# Patient Record
Sex: Male | Born: 1975 | Race: White | Hispanic: No | Marital: Single | Smoking: Former smoker
Health system: Southern US, Community
[De-identification: ages and names within clinical notes are randomized; demographics above are authoritative.]

## PROBLEM LIST (undated history)

## (undated) DIAGNOSIS — G40909 Epilepsy, unspecified, not intractable, without status epilepticus: Secondary | ICD-10-CM

## (undated) DIAGNOSIS — H47033 Optic nerve hypoplasia, bilateral: Secondary | ICD-10-CM

## (undated) DIAGNOSIS — F319 Bipolar disorder, unspecified: Secondary | ICD-10-CM

## (undated) HISTORY — PX: EYE SURGERY: SHX253

---

## 2016-12-24 ENCOUNTER — Other Ambulatory Visit (HOSPITAL_BASED_OUTPATIENT_CLINIC_OR_DEPARTMENT_OTHER): Payer: Self-pay | Admitting: Osteopathic Medicine

## 2016-12-24 DIAGNOSIS — N50812 Left testicular pain: Secondary | ICD-10-CM

## 2016-12-27 ENCOUNTER — Ambulatory Visit (HOSPITAL_BASED_OUTPATIENT_CLINIC_OR_DEPARTMENT_OTHER): Payer: Self-pay

## 2016-12-27 ENCOUNTER — Ambulatory Visit (HOSPITAL_BASED_OUTPATIENT_CLINIC_OR_DEPARTMENT_OTHER): Admission: RE | Admit: 2016-12-27 | Payer: Self-pay | Source: Ambulatory Visit

## 2017-01-01 ENCOUNTER — Ambulatory Visit (HOSPITAL_BASED_OUTPATIENT_CLINIC_OR_DEPARTMENT_OTHER)
Admission: RE | Admit: 2017-01-01 | Discharge: 2017-01-01 | Disposition: A | Discharge status: Home / Self Care | Payer: BC Managed Care – PPO | Source: Ambulatory Visit | Attending: Osteopathic Medicine | Admitting: Osteopathic Medicine

## 2017-01-01 DIAGNOSIS — N50812 Left testicular pain: Secondary | ICD-10-CM | POA: Insufficient documentation

## 2017-01-01 DIAGNOSIS — I861 Scrotal varices: Secondary | ICD-10-CM | POA: Diagnosis not present

## 2021-08-10 DEATH — deceased

## 2022-01-12 ENCOUNTER — Encounter (HOSPITAL_BASED_OUTPATIENT_CLINIC_OR_DEPARTMENT_OTHER): Payer: Self-pay | Admitting: Emergency Medicine

## 2022-01-12 ENCOUNTER — Emergency Department (HOSPITAL_BASED_OUTPATIENT_CLINIC_OR_DEPARTMENT_OTHER): Payer: BC Managed Care – PPO

## 2022-01-12 ENCOUNTER — Emergency Department (HOSPITAL_BASED_OUTPATIENT_CLINIC_OR_DEPARTMENT_OTHER)
Admission: EM | Admit: 2022-01-12 | Discharge: 2022-01-12 | Disposition: A | Discharge status: Home / Self Care | Payer: BC Managed Care – PPO | Attending: Emergency Medicine | Admitting: Emergency Medicine

## 2022-01-12 ENCOUNTER — Other Ambulatory Visit: Payer: Self-pay

## 2022-01-12 DIAGNOSIS — R072 Precordial pain: Secondary | ICD-10-CM | POA: Insufficient documentation

## 2022-01-12 DIAGNOSIS — R202 Paresthesia of skin: Secondary | ICD-10-CM | POA: Insufficient documentation

## 2022-01-12 DIAGNOSIS — D75839 Thrombocytosis, unspecified: Secondary | ICD-10-CM | POA: Diagnosis not present

## 2022-01-12 DIAGNOSIS — R200 Anesthesia of skin: Secondary | ICD-10-CM | POA: Diagnosis present

## 2022-01-12 HISTORY — DX: Optic nerve hypoplasia, bilateral: H47.033

## 2022-01-12 HISTORY — DX: Bipolar disorder, unspecified: F31.9

## 2022-01-12 HISTORY — DX: Epilepsy, unspecified, not intractable, without status epilepticus: G40.909

## 2022-01-12 LAB — BASIC METABOLIC PANEL
Anion gap: 6 (ref 5–15)
BUN: 24 mg/dL — ABNORMAL HIGH (ref 6–20)
CO2: 26 mmol/L (ref 22–32)
Calcium: 8.7 mg/dL — ABNORMAL LOW (ref 8.9–10.3)
Chloride: 104 mmol/L (ref 98–111)
Creatinine, Ser: 0.86 mg/dL (ref 0.61–1.24)
GFR, Estimated: >60 mL/min (ref >60)
Glucose, Bld: 107 mg/dL — ABNORMAL HIGH (ref 70–99)
Potassium: 3.8 mmol/L (ref 3.5–5.1)
Sodium: 136 mmol/L (ref 135–145)

## 2022-01-12 LAB — TROPONIN I (HIGH SENSITIVITY)
Troponin I (High Sensitivity): <2 ng/L (ref <18)
Troponin I (High Sensitivity): <2 ng/L (ref <18)

## 2022-01-12 LAB — CBC
HCT: 45.4 % (ref 39.0–52.0)
Hemoglobin: 15.8 g/dL (ref 13.0–17.0)
MCH: 30.2 pg (ref 26.0–34.0)
MCHC: 34.8 g/dL (ref 30.0–36.0)
MCV: 86.6 fL (ref 80.0–100.0)
Platelets: 558 10*3/uL — ABNORMAL HIGH (ref 150–400)
RBC: 5.24 MIL/uL (ref 4.22–5.81)
RDW: 12.4 % (ref 11.5–15.5)
WBC: 8.4 10*3/uL (ref 4.0–10.5)
nRBC: 0 % (ref 0.0–0.2)

## 2022-01-12 LAB — RAPID URINE DRUG SCREEN, HOSP PERFORMED
Amphetamines: NOT DETECTED
Barbiturates: NOT DETECTED
Benzodiazepines: NOT DETECTED
Cocaine: NOT DETECTED
Opiates: NOT DETECTED
Tetrahydrocannabinol: NOT DETECTED

## 2022-01-12 MED ORDER — IOHEXOL 350 MG/ML SOLN
75.0000 mL | Freq: Once | INTRAVENOUS | Status: AC | PRN
  Administered 2022-01-12: 75 mL via INTRAVENOUS

## 2022-01-12 MED ORDER — ALUM & MAG HYDROXIDE-SIMETH 200-200-20 MG/5ML PO SUSP
30.0000 mL | Freq: Once | ORAL | Status: AC
  Administered 2022-01-12: 30 mL via ORAL
  Filled 2022-01-12: qty 30

## 2022-01-12 NOTE — ED Triage Notes (Signed)
Numbness in feet X 1 week laying down tonight felt disoriented and chest pain.

## 2022-01-12 NOTE — ED Provider Notes (Signed)
[facility] EMERGENCY DEPARTMENT Provider Note   CSN: [id number] Arrival date & time: 01/12/22  0017     History  Chief Complaint  Patient presents with   Numbness   Chest Pain    Jesse Raymond is a 46 y.o. male.  The history is provided by the patient.  Chest Pain Pain location:  Substernal area Pain quality: dull   Pain radiates to:  Does not radiate Pain severity:  Moderate Onset quality:  Sudden Duration:  5 hours Progression:  Unchanged Chronicity:  New Context: at rest   Relieved by:  Nothing Worsened by:  Nothing Ineffective treatments:  None tried Associated symptoms: no abdominal pain, no back pain, no cough, no diaphoresis, no fever, no headache, no lower extremity edema, no orthopnea, no palpitations, no PND, no shortness of breath, no vomiting and no weakness   Risk factors: male sex   Risk factors: no aortic disease and no hypertension   Patient with bipolar disorder presents with paresthesias in feet for more than a week.  No weakness, no changes in vision or speech.  Tonight, at rest then developed chest pain.  No DOE, no SOB, no nausea no vomiting no diaphoresis.  No leg pain or swelling.  States he has been feeling anxious lately.  No f/c/r.  No wounds on the feet.  No trauma  Past Medical History:  Diagnosis Date   Bipolar 1 disorder (HCC)    Epilepsy (HCC)    Optic nerve hypoplasia of both eyes        Home Medications Prior to Admission medications   Not on File      Allergies    Patient has no known allergies.    Review of Systems   Review of Systems  Constitutional:  Negative for diaphoresis and fever.  HENT:  Negative for congestion.   Eyes:  Negative for redness.  Respiratory:  Negative for cough and shortness of breath.   Cardiovascular:  Positive for chest pain. Negative for palpitations, orthopnea, leg swelling and PND.  Gastrointestinal:  Negative for abdominal pain and vomiting.  Musculoskeletal:  Negative for back  pain, neck pain and neck stiffness.  Skin:  Negative for wound.  Neurological:  Negative for facial asymmetry, weakness and headaches.  Psychiatric/Behavioral:  Negative for agitation. The patient is nervous/anxious.   All other systems reviewed and are negative.  Physical Exam Updated Vital Signs BP (!) 138/92    Pulse 95    Temp 98.6 F (37 C) (Oral)    Resp 13    Ht 5\' 11"  (1.803 m)    Wt 113.4 kg    SpO2 99%    BMI 34.87 kg/m  Physical Exam Vitals and nursing note reviewed. Exam conducted with a chaperone present.  Constitutional:      General: He is not in acute distress.    Appearance: Normal appearance.  HENT:     Head: Normocephalic and atraumatic.     Nose: Nose normal.  Eyes:     Conjunctiva/sclera: Conjunctivae normal.     Pupils: Pupils are equal, round, and reactive to light.  Cardiovascular:     Rate and Rhythm: Normal rate and regular rhythm.     Pulses: Normal pulses.     Heart sounds: Normal heart sounds.  Pulmonary:     Effort: Pulmonary effort is normal.     Breath sounds: Normal breath sounds.  Abdominal:     General: Abdomen is flat. Bowel sounds are normal.  Palpations: Abdomen is soft.     Tenderness: There is no abdominal tenderness. There is no guarding.  Musculoskeletal:        General: No tenderness. Normal range of motion.     Cervical back: Normal range of motion and neck supple.     Right lower leg: No edema.     Left lower leg: No edema.  Skin:    General: Skin is warm and dry.     Capillary Refill: Capillary refill takes less than 2 seconds.  Neurological:     General: No focal deficit present.     Mental Status: He is alert and oriented to person, place, and time.     Sensory: No sensory deficit.     Deep Tendon Reflexes: Reflexes normal.     Comments: Sensation and motor are intact to direct confrontation to all nerve distributions of the feet ankles and lower legs B.  There is no numbness.  Psychiatric:        Thought Content:  Thought content normal.    ED Results / Procedures / Treatments   Labs (all labs ordered are listed, but only abnormal results are displayed) Results for orders placed or performed during the hospital encounter of 01/12/22  Basic metabolic panel  Result Value Ref Range   Sodium 136 135 - 145 mmol/L   Potassium 3.8 3.5 - 5.1 mmol/L   Chloride 104 98 - 111 mmol/L   CO2 26 22 - 32 mmol/L   Glucose, Bld 107 (H) 70 - 99 mg/dL   BUN 24 (H) 6 - 20 mg/dL   Creatinine, Ser 9.140.86 0.61 - 1.24 mg/dL   Calcium 8.7 (L) 8.9 - 10.3 mg/dL   GFR, Estimated >78>60 >29>60 mL/min   Anion gap 6 5 - 15  CBC  Result Value Ref Range   WBC 8.4 4.0 - 10.5 K/uL   RBC 5.24 4.22 - 5.81 MIL/uL   Hemoglobin 15.8 13.0 - 17.0 g/dL   HCT 56.245.4 13.039.0 - 86.552.0 %   MCV 86.6 80.0 - 100.0 fL   MCH 30.2 26.0 - 34.0 pg   MCHC 34.8 30.0 - 36.0 g/dL   RDW 78.412.4 69.611.5 - 29.515.5 %   Platelets 558 (H) 150 - 400 K/uL   nRBC 0.0 0.0 - 0.2 %  Rapid urine drug screen (hospital performed)  Result Value Ref Range   Opiates NONE DETECTED NONE DETECTED   Cocaine NONE DETECTED NONE DETECTED   Benzodiazepines NONE DETECTED NONE DETECTED   Amphetamines NONE DETECTED NONE DETECTED   Tetrahydrocannabinol NONE DETECTED NONE DETECTED   Barbiturates NONE DETECTED NONE DETECTED  Troponin I (High Sensitivity)  Result Value Ref Range   Troponin I (High Sensitivity) <2 <18 ng/L  Troponin I (High Sensitivity)  Result Value Ref Range   Troponin I (High Sensitivity) <2 <18 ng/L   DG Chest 2 View  Result Date: 01/12/2022 CLINICAL DATA:  Chest pain, numbness in feet EXAM: CHEST - 2 VIEW COMPARISON:  None. FINDINGS: Cardiac and mediastinal contours are within normal limits. No focal pulmonary opacity. No pleural effusion or pneumothorax. No acute osseous abnormality. IMPRESSION: No acute cardiopulmonary process. Electronically Signed   By: Wiliam KeAlison  Vasan M.D.   On: 01/12/2022 00:43   CT Angio Chest PE W and/or Wo Contrast  Result Date: 01/12/2022 CLINICAL  DATA:  Chest pain EXAM: CT ANGIOGRAPHY CHEST WITH CONTRAST TECHNIQUE: Multidetector CT imaging of the chest was performed using the standard protocol during bolus administration of intravenous contrast. Multiplanar CT  image reconstructions and MIPs were obtained to evaluate the vascular anatomy. RADIATION DOSE REDUCTION: This exam was performed according to the departmental dose-optimization program which includes automated exposure control, adjustment of the mA and/or kV according to patient size and/or use of iterative reconstruction technique. CONTRAST:  38mL OMNIPAQUE IOHEXOL 350 MG/ML SOLN COMPARISON:  None. FINDINGS: Cardiovascular: Satisfactory opacification the bilateral pulmonary arteries to the lobar level. No evidence of pulmonary embolism. Although not tailored for evaluation of the thoracic aorta, there is no evidence thoracic aortic aneurysm or dissection. The heart is normal in size.  No pericardial effusion. Mediastinum/Nodes: No suspicious mediastinal lymphadenopathy. Visualized thyroid is unremarkable. Lungs/Pleura: Lungs are clear. No suspicious pulmonary nodules. No focal consolidation. No pleural effusion or pneumothorax. Upper Abdomen: Visualized upper abdomen is grossly unremarkable. Musculoskeletal: Degenerative changes of the visualized thoracolumbar spine. Review of the MIP images confirms the above findings. IMPRESSION: No evidence of pulmonary embolism. Negative CT chest. Electronically Signed   By: Charline Bills M.D.   On: 01/12/2022 03:36    EKG EKG Interpretation  Date/Time:  Friday January 12 2022 00:29:14 EST Ventricular Rate:  112 PR Interval:  190 QRS Duration: 92 QT Interval:  318 QTC Calculation: 434 R Axis:   57 Text Interpretation: Sinus tachycardia Confirmed by Verma Grothaus ([id number]) on 01/12/2022 3:01:05 AM  Radiology DG Chest 2 View  Result Date: 01/12/2022 CLINICAL DATA:  Chest pain, numbness in feet EXAM: CHEST - 2 VIEW COMPARISON:  None. FINDINGS:  Cardiac and mediastinal contours are within normal limits. No focal pulmonary opacity. No pleural effusion or pneumothorax. No acute osseous abnormality. IMPRESSION: No acute cardiopulmonary process. Electronically Signed   By: Wiliam Ke M.D.   On: 01/12/2022 00:43   CT Angio Chest PE W and/or Wo Contrast  Result Date: 01/12/2022 CLINICAL DATA:  Chest pain EXAM: CT ANGIOGRAPHY CHEST WITH CONTRAST TECHNIQUE: Multidetector CT imaging of the chest was performed using the standard protocol during bolus administration of intravenous contrast. Multiplanar CT image reconstructions and MIPs were obtained to evaluate the vascular anatomy. RADIATION DOSE REDUCTION: This exam was performed according to the departmental dose-optimization program which includes automated exposure control, adjustment of the mA and/or kV according to patient size and/or use of iterative reconstruction technique. CONTRAST:  38mL OMNIPAQUE IOHEXOL 350 MG/ML SOLN COMPARISON:  None. FINDINGS: Cardiovascular: Satisfactory opacification the bilateral pulmonary arteries to the lobar level. No evidence of pulmonary embolism. Although not tailored for evaluation of the thoracic aorta, there is no evidence thoracic aortic aneurysm or dissection. The heart is normal in size.  No pericardial effusion. Mediastinum/Nodes: No suspicious mediastinal lymphadenopathy. Visualized thyroid is unremarkable. Lungs/Pleura: Lungs are clear. No suspicious pulmonary nodules. No focal consolidation. No pleural effusion or pneumothorax. Upper Abdomen: Visualized upper abdomen is grossly unremarkable. Musculoskeletal: Degenerative changes of the visualized thoracolumbar spine. Review of the MIP images confirms the above findings. IMPRESSION: No evidence of pulmonary embolism. Negative CT chest. Electronically Signed   By: Charline Bills M.D.   On: 01/12/2022 03:36    Procedures Procedures    Medications Ordered in ED Medications  iohexol (OMNIPAQUE) 350 MG/ML  injection 75 mL (75 mLs Intravenous Contrast Given 01/12/22 0317)  alum & mag hydroxide-simeth (MAALOX/MYLANTA) 200-200-20 MG/5ML suspension 30 mL (30 mLs Oral Given 01/12/22 0405)    ED Course/ Medical Decision Making/ A&P                           Medical Decision Making Chest pain tonight  at rest and paresthesias for more than a week.    Amount and/or Complexity of Data Reviewed Independent Historian: spouse    Details: patient was at rest with chest pain External Data Reviewed: labs.    Details: CBC shows thrombocytosis as far back as 4 years ago.  5 cbc exams evaluated all with elevated platelets. Labs: ordered.    Details: normal troponins, normal electrolytes.  thrombocytosis, platelets 558 Radiology: ordered.    Details: Negative CTA and chest xray by me, agree with radiology ECG/medicine tests: ordered.    Details: See muse  Risk OTC drugs. Prescription drug management. Decision regarding hospitalization. Risk Details: Given chest pain, EDP considered hospitalization but patient is well appearing with negative EKG, troponin and negative CTA of the chest.  Ruled out for PE in the ED with low risk history and negative CTA.  Ruled out for MI in the ED with very atypical history for ischemia, negative EKG and 2 negative troponins. Heart score is 2 (age and BMI) low risk for MACE.  I suspect this is anxiety, perhaps related to the paresthesias of the feet.  As sensation is intact to confrontation without weakness.      Final Clinical Impression(s) / ED Diagnoses Final diagnoses:  Paresthesia  Thrombocytosis   Return for intractable cough, coughing up blood, fevers > 100.4 unrelieved by medication, shortness of breath, intractable vomiting, chest pain, shortness of breath, weakness, numbness, changes in speech, facial asymmetry, abdominal pain, passing out, Inability to tolerate liquids or food, cough, altered mental status or any concerns. No signs of systemic illness or infection.  The patient is nontoxic-appearing on exam and vital signs are within normal limits.  I have reviewed the triage vital signs and the nursing notes. Pertinent labs & imaging results that were available during my care of the patient were reviewed by me and considered in my medical decision making (see chart for details). After history, exam, and medical workup I feel the patient has been appropriately medically screened and is safe for discharge home. Pertinent diagnoses were discussed with the patient. Patient was given return precautions.  Rx / DC Orders ED Discharge Orders     None         Liah Morr, MD 01/12/22 [phone]

## 2022-06-01 ENCOUNTER — Other Ambulatory Visit: Payer: Self-pay

## 2022-06-01 ENCOUNTER — Encounter (HOSPITAL_BASED_OUTPATIENT_CLINIC_OR_DEPARTMENT_OTHER): Payer: Self-pay | Admitting: Emergency Medicine

## 2022-06-01 ENCOUNTER — Emergency Department (HOSPITAL_BASED_OUTPATIENT_CLINIC_OR_DEPARTMENT_OTHER)
Admission: EM | Admit: 2022-06-01 | Discharge: 2022-06-01 | Disposition: A | Discharge status: Home / Self Care | Payer: BC Managed Care – PPO | Attending: Emergency Medicine | Admitting: Emergency Medicine

## 2022-06-01 DIAGNOSIS — I10 Essential (primary) hypertension: Secondary | ICD-10-CM

## 2022-06-01 DIAGNOSIS — S0993XA Unspecified injury of face, initial encounter: Secondary | ICD-10-CM | POA: Diagnosis present

## 2022-06-01 DIAGNOSIS — S01419A Laceration without foreign body of unspecified cheek and temporomandibular area, initial encounter: Secondary | ICD-10-CM | POA: Diagnosis not present

## 2022-06-01 DIAGNOSIS — S0181XA Laceration without foreign body of other part of head, initial encounter: Secondary | ICD-10-CM

## 2022-06-01 DIAGNOSIS — W268XXA Contact with other sharp object(s), not elsewhere classified, initial encounter: Secondary | ICD-10-CM | POA: Insufficient documentation

## 2023-03-03 ENCOUNTER — Emergency Department (HOSPITAL_BASED_OUTPATIENT_CLINIC_OR_DEPARTMENT_OTHER): Payer: BC Managed Care – PPO

## 2023-03-03 ENCOUNTER — Other Ambulatory Visit: Payer: Self-pay

## 2023-03-03 ENCOUNTER — Emergency Department (HOSPITAL_BASED_OUTPATIENT_CLINIC_OR_DEPARTMENT_OTHER)
Admission: EM | Admit: 2023-03-03 | Discharge: 2023-03-03 | Disposition: A | Discharge status: Home / Self Care | Payer: BC Managed Care – PPO | Attending: Emergency Medicine | Admitting: Emergency Medicine

## 2023-03-03 ENCOUNTER — Encounter (HOSPITAL_BASED_OUTPATIENT_CLINIC_OR_DEPARTMENT_OTHER): Payer: Self-pay | Admitting: Pediatrics

## 2023-03-03 DIAGNOSIS — N50811 Right testicular pain: Secondary | ICD-10-CM | POA: Diagnosis present

## 2023-03-03 DIAGNOSIS — N451 Epididymitis: Secondary | ICD-10-CM | POA: Diagnosis not present

## 2023-03-03 LAB — URINALYSIS, MICROSCOPIC (REFLEX)

## 2023-03-03 LAB — URINALYSIS, ROUTINE W REFLEX MICROSCOPIC
Bilirubin Urine: NEGATIVE
Glucose, UA: NEGATIVE mg/dL
Ketones, ur: NEGATIVE mg/dL
Leukocytes,Ua: NEGATIVE
Nitrite: NEGATIVE
Protein, ur: NEGATIVE mg/dL
Specific Gravity, Urine: 1.02 (ref 1.005–1.030)
pH: 7.5 (ref 5.0–8.0)

## 2023-03-03 MED ORDER — OXYCODONE-ACETAMINOPHEN 5-325 MG PO TABS: 1.0000 | ORAL_TABLET | Freq: Four times a day (QID) | ORAL | 0 refills | Status: AC | PRN

## 2023-03-03 MED ORDER — SENNOSIDES-DOCUSATE SODIUM 8.6-50 MG PO TABS: 1.0000 | ORAL_TABLET | Freq: Every evening | ORAL | 0 refills | Status: AC | PRN

## 2023-03-03 MED ORDER — LEVOFLOXACIN 500 MG PO TABS: 500.0000 mg | ORAL_TABLET | Freq: Every day | ORAL | 0 refills | Status: AC

## 2023-03-03 NOTE — ED Notes (Signed)
ED Provider at bedside. 

## 2023-03-03 NOTE — ED Notes (Signed)
Patient transported to [facility] 

## 2023-03-03 NOTE — Discharge Instructions (Signed)
You were seen in the emergency room today with right testicle and flank pain.  I suspect this is recurrent epididymitis and I am treating you with antibiotic again.  Have called in some stronger pain medicine for severe pain but realizes can cause dependency and you cannot drink alcohol or drive a car if you are taking this medicine.  It will also cause constipation so I called in some constipation medicine for you.  Given this is her second episode of this in the past 3 months I would recommend follow-up with urologist.  Have listed the name of my on-call practice on this form but you could also reach out to someone in the [facility] system if you'd prefer.

## 2023-03-03 NOTE — ED Provider Notes (Signed)
Emergency Department Provider Note   I have reviewed the triage vital signs and the nursing notes.   HISTORY  Chief Complaint Abdominal Pain and Testicle Pain   HPI Jesse Raymond is a 47 y.o. male past history reviewed below presents emergency department with right testicle pain radiating into the right lower abdomen and right flank.  Symptoms began suddenly and without clear provocation starting yesterday.  No fevers or chills.  No dysuria.  No gross hematuria.  He was diagnosed with epididymitis clinically by his primary care physician in [facility] on 1/12.  He completed a 10-day course of Levaquin and symptoms improved.  He notes that this does remind him of this prior episode. No trauma to the area.    Past Medical History:  Diagnosis Date   Bipolar 1 disorder ([place])    Epilepsy ([place])    Optic nerve hypoplasia of both eyes     Review of Systems  Constitutional: No fever/chills Cardiovascular: Denies chest pain. Respiratory: Denies shortness of breath. Gastrointestinal: Positive RLQ abdominal pain.  No nausea, no vomiting.  No diarrhea.  No constipation. Genitourinary: Negative for dysuria. Positive right testicle pain.  Musculoskeletal: Negative for back pain. Skin: Negative for rash. Neurological: Negative for headaches.   ____________________________________________   PHYSICAL EXAM:  VITAL SIGNS: ED Triage Vitals  Enc Vitals Group     BP 03/03/23 1106 139/89     Pulse Rate 03/03/23 1106 92     Resp 03/03/23 1106 16     Temp 03/03/23 1106 98.2 F (36.8 C)     Temp Source 03/03/23 1106 Oral     SpO2 03/03/23 1106 99 %     Weight 03/03/23 1110 240 lb (108.9 kg)     Height 03/03/23 1110 5\' 11"  (1.803 m)   Constitutional: Alert and oriented. Well appearing and in no acute distress. Eyes: Conjunctivae are normal.  Head: Atraumatic. Nose: No congestion/rhinnorhea. Mouth/Throat: Mucous membranes are moist.  Neck: No stridor.   Cardiovascular: Normal  rate, regular rhythm. Good peripheral circulation. Grossly normal heart sounds.   Respiratory: Normal respiratory effort.  No retractions. Lungs CTAB. Gastrointestinal: Soft and nontender.  Specifically no RLQ tenderness. No palpable hernia. No distention.  Genitourinary: Minimal right anterior testicular tenderness.  No scrotal cellulitis or fluid collection.  No palpable inguinal hernia.  Musculoskeletal: No gross deformities of extremities. Neurologic:  Normal speech and language.  Skin:  Skin is warm, dry and intact. No rash noted.  ____________________________________________   LABS (all labs ordered are listed, but only abnormal results are displayed)  Labs Reviewed  URINALYSIS, ROUTINE W REFLEX MICROSCOPIC - Abnormal; Notable for the following components:      Result Value   Hgb urine dipstick TRACE (*)    All other components within normal limits  URINALYSIS, MICROSCOPIC (REFLEX) - Abnormal; Notable for the following components:   Bacteria, UA RARE (*)    All other components within normal limits  GC/CHLAMYDIA PROBE AMP ([place]) NOT AT [facility]   ____________________________________________  RADIOLOGY  US SCROTUM W/DOPPLER  Result Date: 03/03/2023 CLINICAL DATA:  Right testicular pain EXAM: SCROTAL ULTRASOUND DOPPLER ULTRASOUND OF THE TESTICLES TECHNIQUE: Complete ultrasound examination of the testicles, epididymis, and other scrotal structures was performed. Color and spectral Doppler ultrasound were also utilized to evaluate blood flow to the testicles. COMPARISON:  01/01/2017 FINDINGS: Right testicle Measurements: 4.2 x 2.3 x 2.7 cm. No mass or microlithiasis visualized. Left testicle Measurements: 4.1 x 2.5 x 2.4 cm. No mass or microlithiasis  visualized. Right epididymis: Mildly enlarged and heterogeneous, although no significant hyperemia. Left epididymis:  Normal in size and appearance. Hydrocele:  Trace left hydrocele. Varicocele:  Left varicocele. Pulsed Doppler  interrogation of both testes demonstrates normal low resistance arterial and venous waveforms bilaterally. IMPRESSION: 1. Negative for testicular torsion or intratesticular mass. 2. Mildly enlarged and heterogeneous right epididymis without significant hyperemia. Findings are nonspecific but could reflect mild epididymitis. 3. Trace left hydrocele. 4. Left varicocele. Electronically Signed   By: Davina Poke D.O.   On: 03/03/2023 12:15    ____________________________________________   PROCEDURES  Procedure(s) performed:   Procedures  None  ____________________________________________   INITIAL IMPRESSION / ASSESSMENT AND PLAN / ED COURSE  Pertinent labs & imaging results that were available during my care of the patient were reviewed by me and considered in my medical decision making (see chart for details).   This patient is Presenting for Evaluation of abdominal pain, which does require a range of treatment options, and is a complaint that involves a high risk of morbidity and mortality.  The Differential Diagnoses includes but is not exclusive to acute appendicitis, renal colic, testicular torsion, urinary tract infection, prostatitis,  diverticulitis, small bowel obstruction, colitis, abdominal aortic aneurysm, gastroenteritis, constipation etc.  I did obtain Additional Historical Information from wife at bedside.   I decided to review pertinent External Data, and in summary patient treated with 10-day course of Levaquin after his outpatient appointment with Dr. Nance Pew.   Clinical Laboratory Tests Ordered, included UA without clear infection.   Radiologic Tests Ordered, included testicular US. I independently interpreted the images and agree with radiology interpretation.    Social Determinants of Health Risk patient is not an active smoker.   Medical Decision Making: Summary:  Patient presents emergency department with right lower quadrant abdominal pain radiating to the  testicle.  Epididymitis is a consideration but plan for Doppler ultrasound along with UA and will send STI screening but patient is low risk.   Reevaluation with update and discussion with patient and wife at bedside.  Ultrasound shows possible early epididymitis which is consistent with patient's symptoms and prior presentation.  Do not plan for abdominal CT imaging although this was considered.  Plan to start Levaquin for 10 days as before.  Cautioned regarding tendon pain and rupture and if pain develops should discontinue antibiotic and present to the primary care doctor or ER. Also advised urology follow-up given this is his second episode of epididymitis in the last 3 months.    Patient's presentation is most consistent with acute presentation with potential threat to life or bodily function.   Disposition: discharge  ____________________________________________  FINAL CLINICAL IMPRESSION(S) / ED DIAGNOSES  Final diagnoses:  Epididymitis, right     NEW OUTPATIENT MEDICATIONS STARTED DURING THIS VISIT:  Discharge Medication List as of 03/03/2023 12:36 PM     START taking these medications   Details  levofloxacin (LEVAQUIN) 500 MG tablet Take 1 tablet (500 mg total) by mouth daily for 10 days., Starting Sun 03/03/2023, Until Wed 03/13/2023, Normal    oxyCODONE-acetaminophen (PERCOCET/ROXICET) 5-325 MG tablet Take 1 tablet by mouth every 6 (six) hours as needed for severe pain., Starting Sun 03/03/2023, Normal    senna-docusate (SENOKOT-S) 8.6-50 MG tablet Take 1 tablet by mouth at bedtime as needed for mild constipation., Starting Sun 03/03/2023, Normal        Note:  This document was prepared using Dragon voice recognition software and may include unintentional dictation errors.  Nanda Quinton,  MD, [facility]    [place]ak[place] Dona, [facility], MD 03/04/23 [phone]

## 2023-03-03 NOTE — ED Triage Notes (Signed)
Hx Epididymis a month ago Pain in right side of abdomen, back and testicle, started yesterday. Pt states it feels better when he lies down.  No pain w urination

## 2023-03-04 LAB — GC/CHLAMYDIA PROBE AMP (~~LOC~~) NOT AT ARMC
Chlamydia: NEGATIVE
Comment: NEGATIVE
Comment: NORMAL
Neisseria Gonorrhea: NEGATIVE

## 2023-11-26 IMAGING — DX DG CHEST 2V
2 series · 2 of 2 positions shown · non-contrast
Comparison: None.

CLINICAL DATA: Chest pain, numbness in feet

EXAM:
CHEST - 2 VIEW

[chest lat]
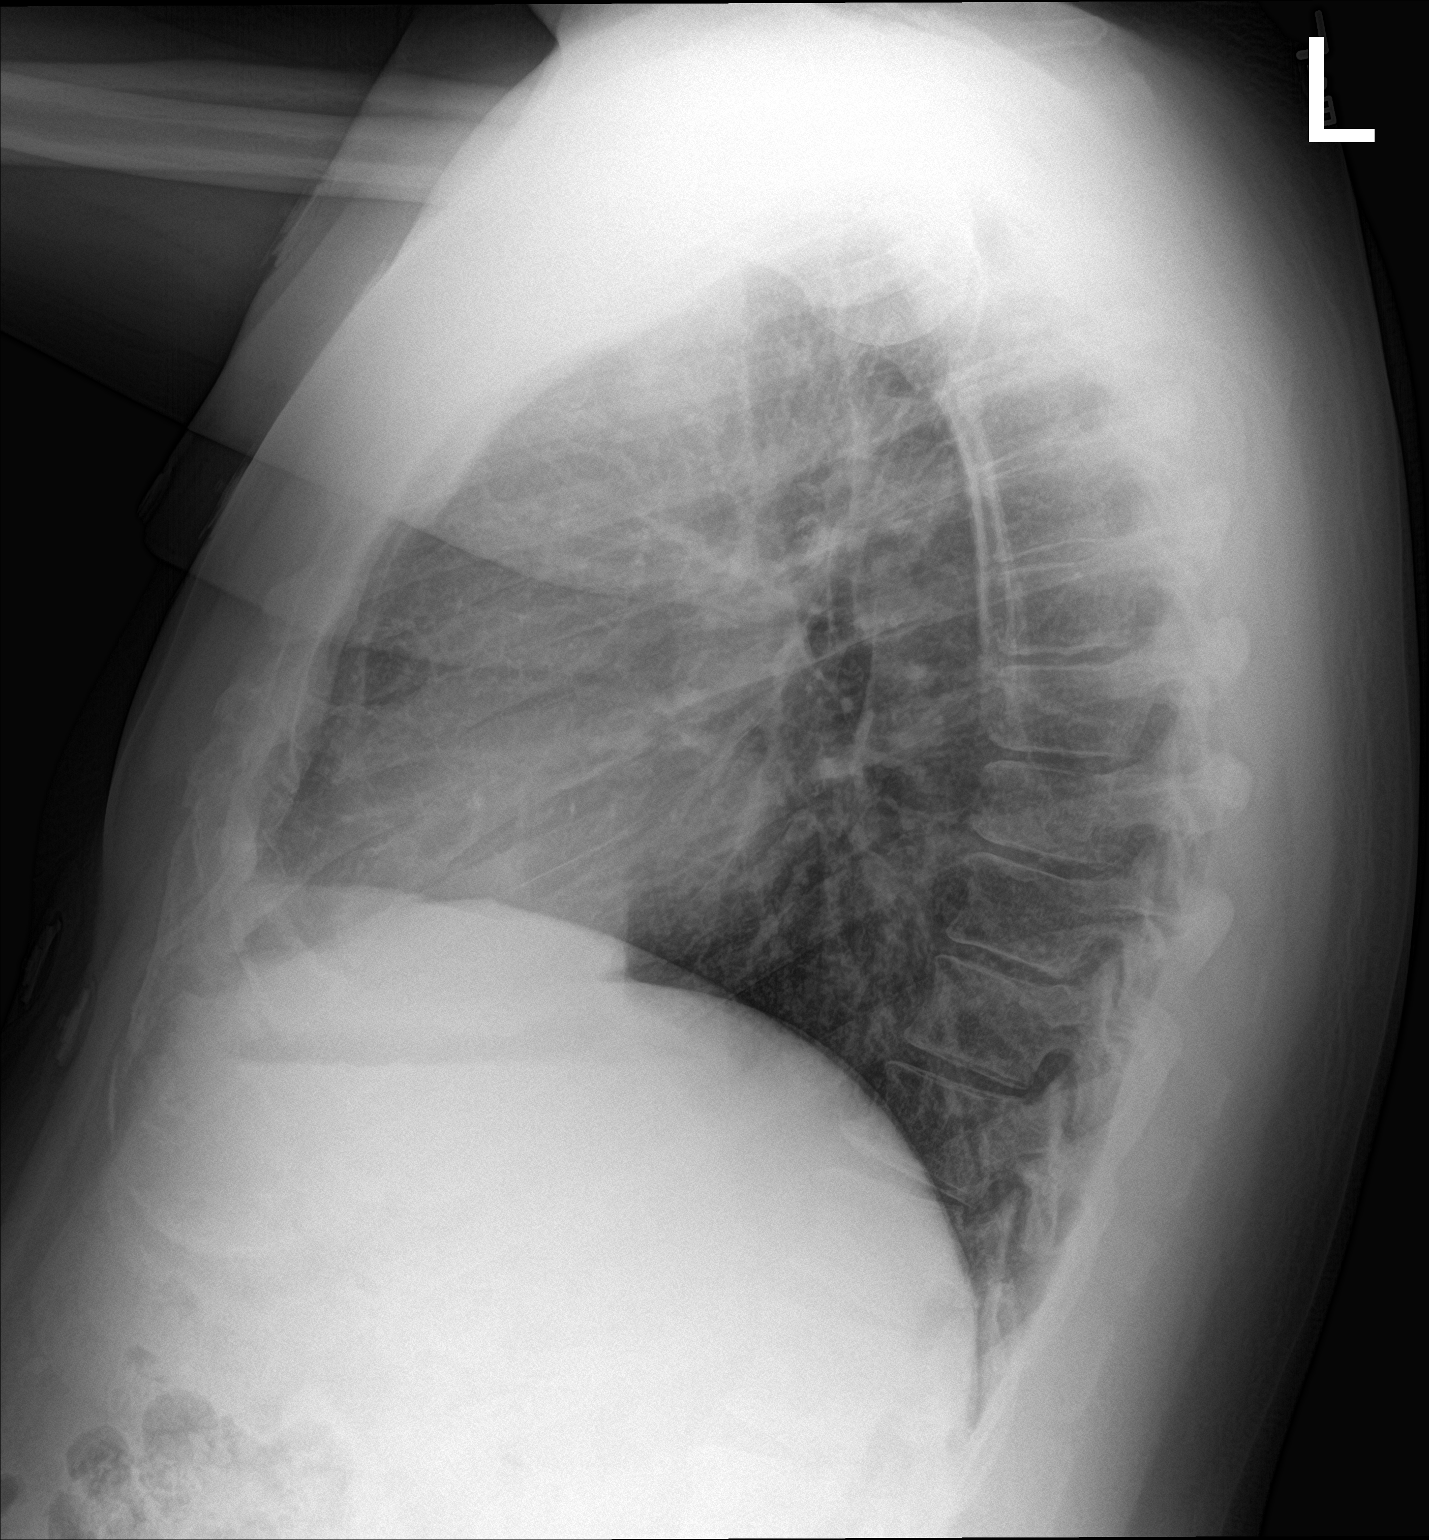

[chest pa]
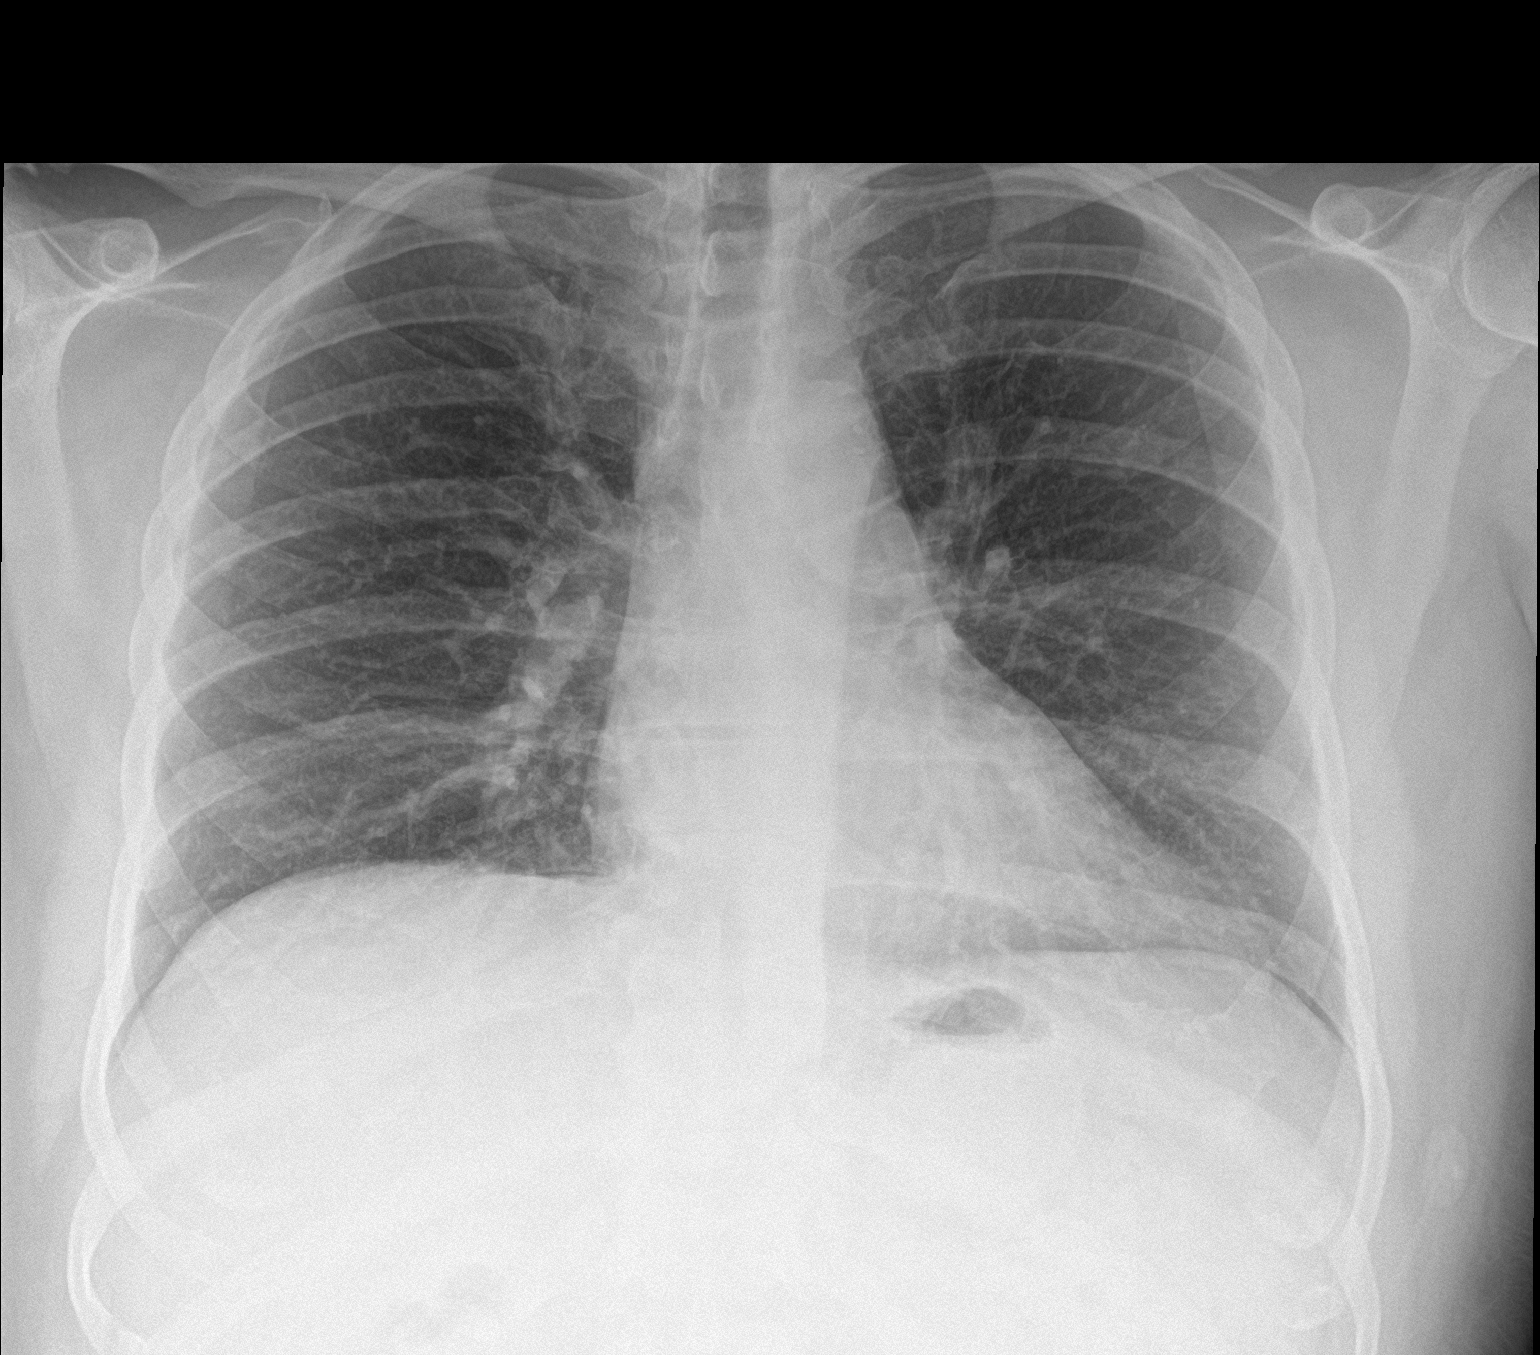

[2 of 2 positions shown; findings below may reference images not displayed]

FINDINGS: Cardiac and mediastinal contours are within normal limits. No focal
pulmonary opacity. No pleural effusion or pneumothorax. No acute
osseous abnormality.
IMPRESSION: No acute cardiopulmonary process.

## 2023-11-26 IMAGING — CT CT ANGIO CHEST
2 of 9 series · 19 of 36 positions shown · IV contrast (agent unspecified)
Comparison: None.

CLINICAL DATA: Chest pain

EXAM:
CT ANGIOGRAPHY CHEST WITH CONTRAST
TECHNIQUE: Multidetector CT imaging of the chest was performed using the
standard protocol during bolus administration of intravenous
contrast. Multiplanar CT image reconstructions and MIPs were
obtained to evaluate the vascular anatomy.

[Series 7: pe thins · axial · 0.98mm/px · z∈[+1078,+1326]mm · 18 of 278 slices shown]
[im 15/278  lung]
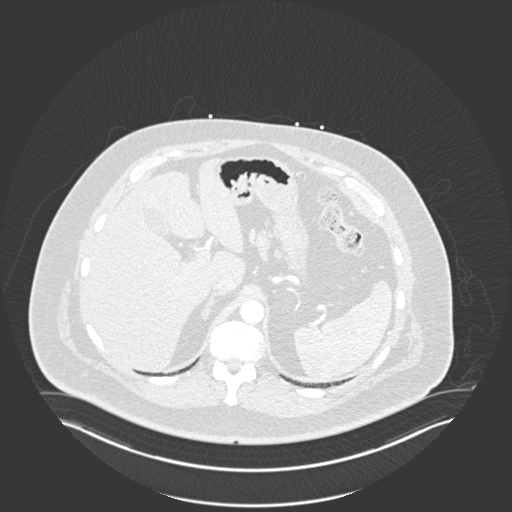
[im 30/278  mediastinal]
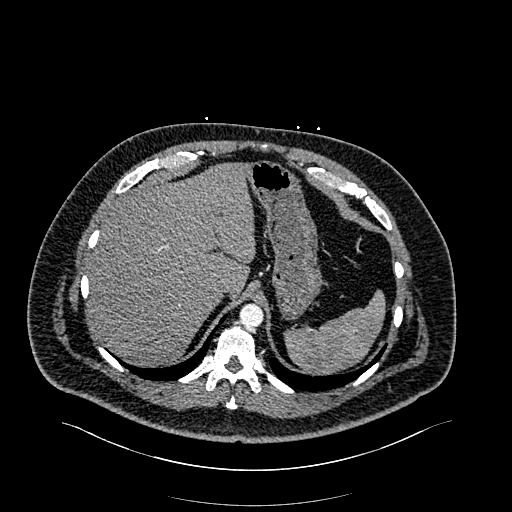
[im 44/278  lung]
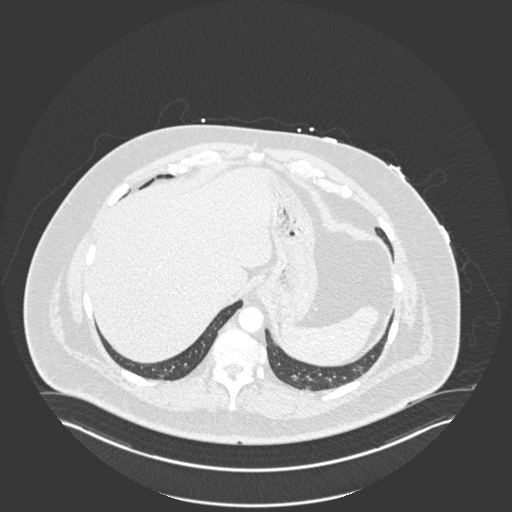
[im 59/278  mediastinal]
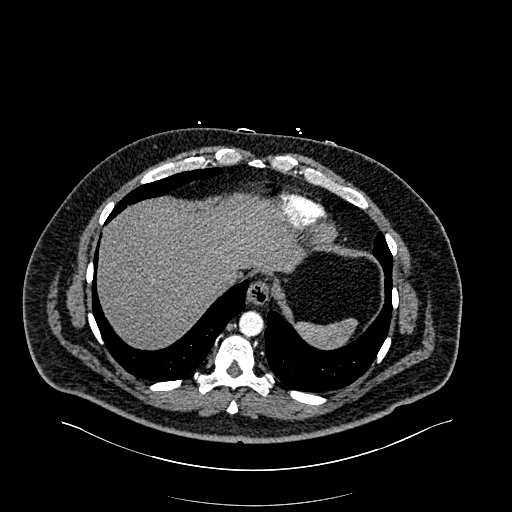
[im 73/278  lung]
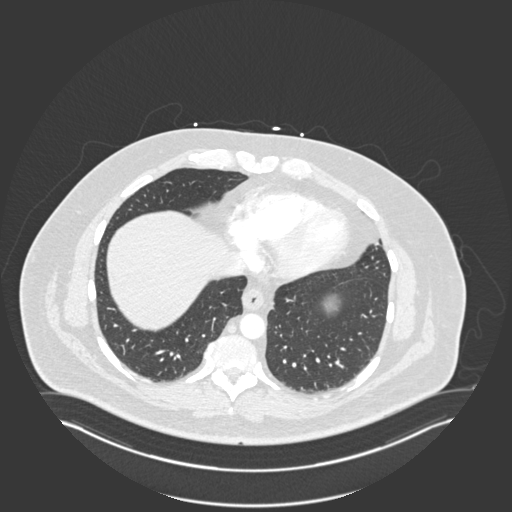
[im 88/278  mediastinal]
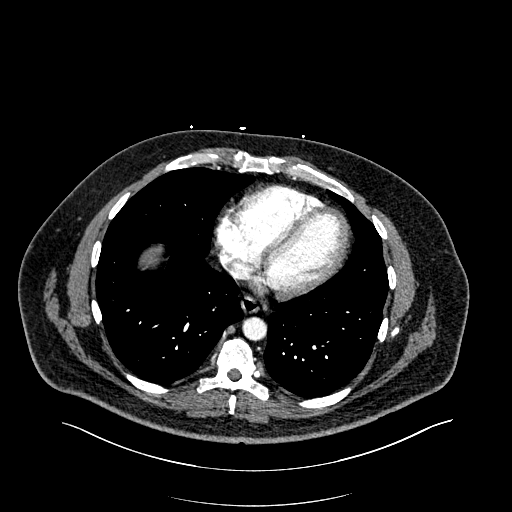
[im 103/278  lung]
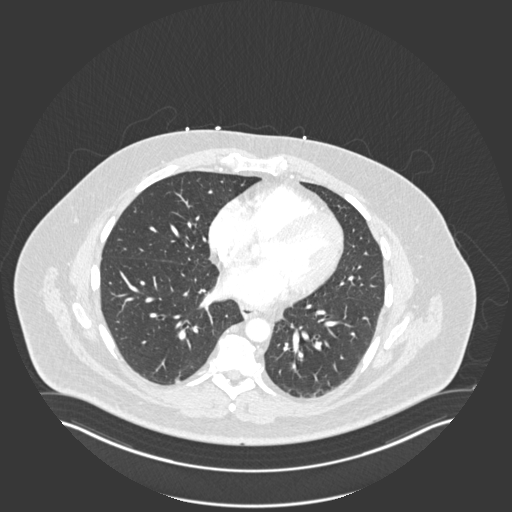
[im 117/278  mediastinal]
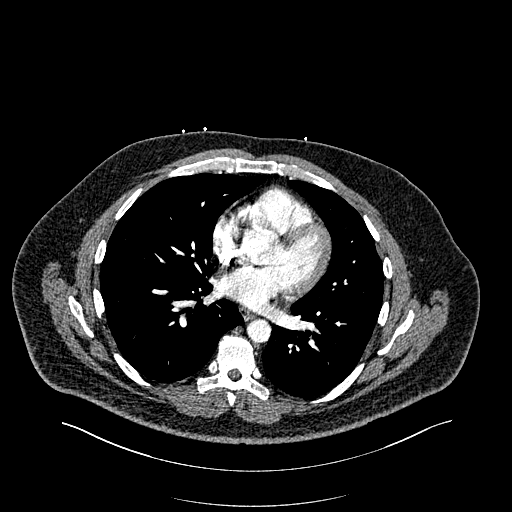
[im 132/278  lung]
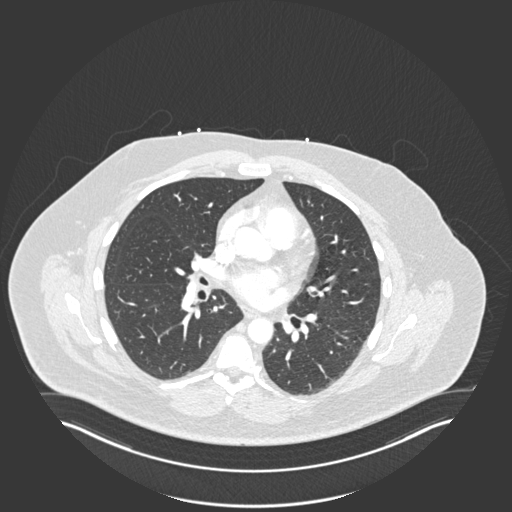
[im 146/278  mediastinal]
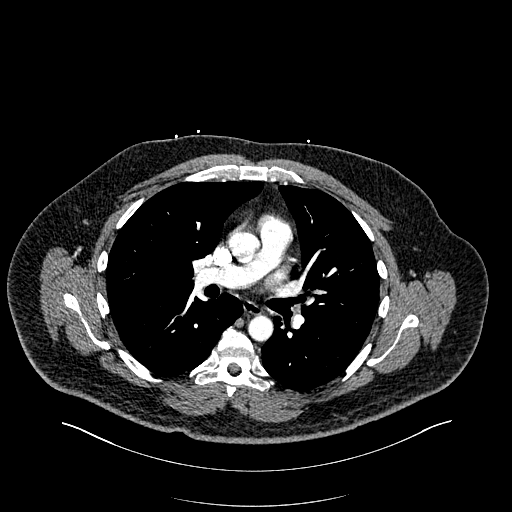
[im 161/278  lung]
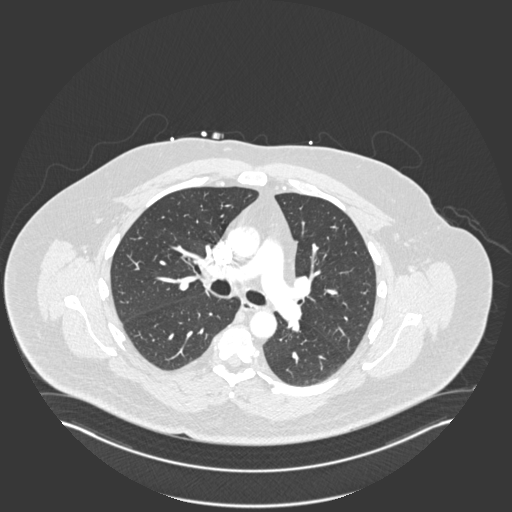
[im 175/278  mediastinal]
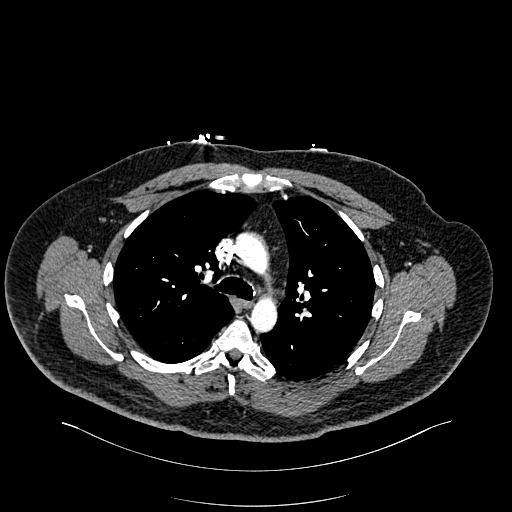
[im 190/278  lung]
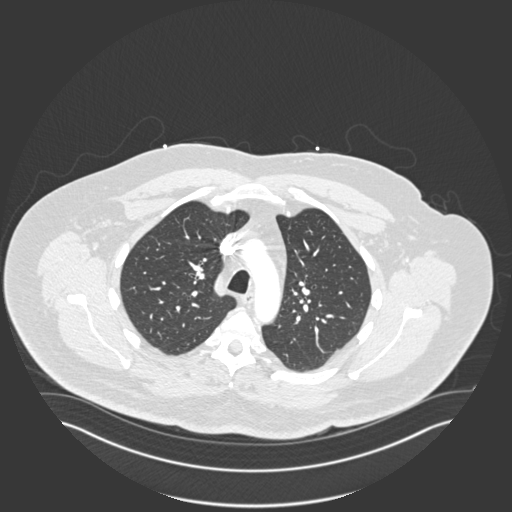
[im 205/278  mediastinal]
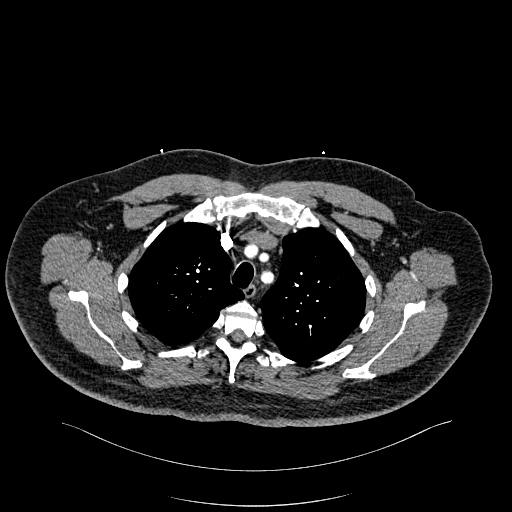
[im 219/278  lung]
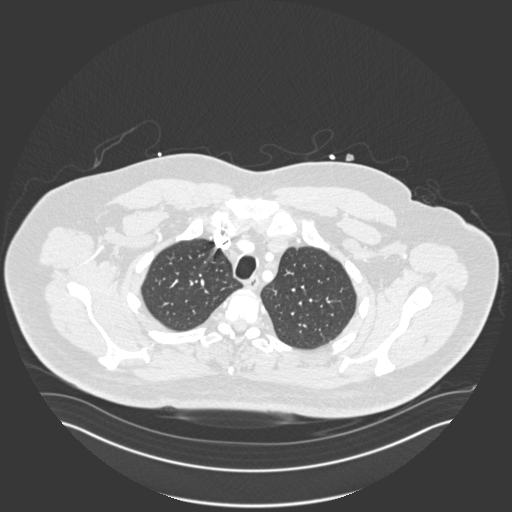
[im 234/278  mediastinal]
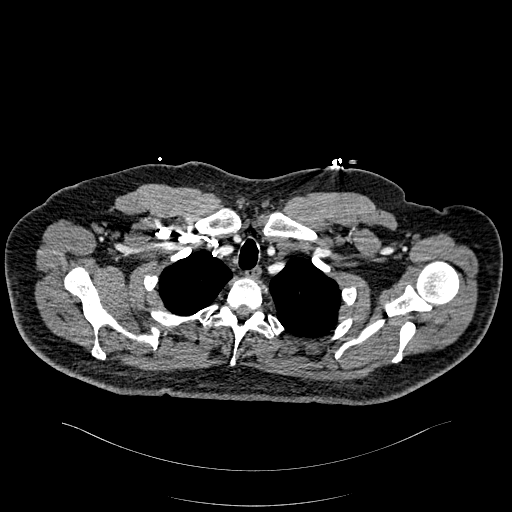
[im 248/278  lung]
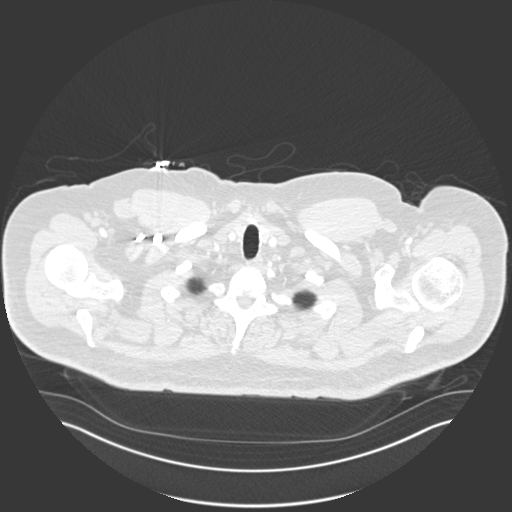
[im 263/278  mediastinal]
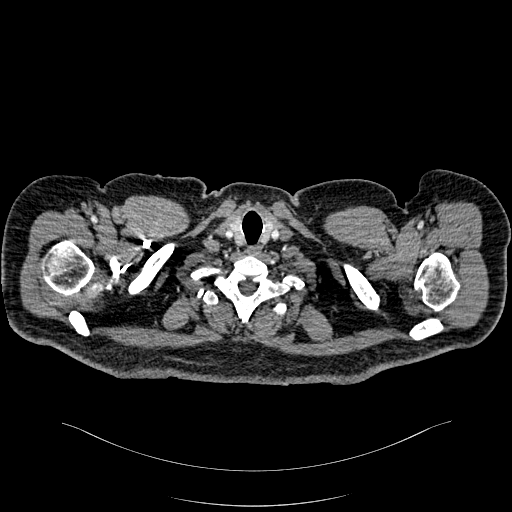

[Series 8: pe coronal mpr · coronal · 0.56mm/px · 1 of 146 slices shown]
[im 73/146  mediastinal]
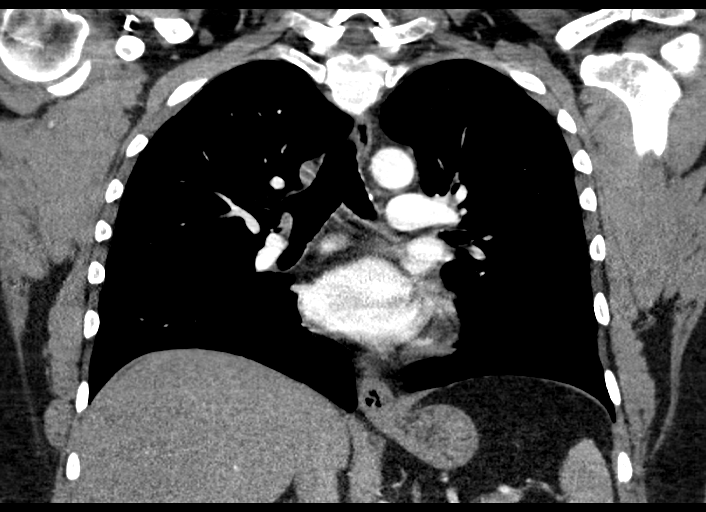

[19 of 36 positions shown; findings below may reference images not displayed]

RADIATION DOSE REDUCTION: This exam was performed according to the
departmental dose-optimization program which includes automated
exposure control, adjustment of the mA and/or kV according to
patient size and/or use of iterative reconstruction technique.

CONTRAST:  75mL OMNIPAQUE IOHEXOL 350 MG/ML SOLN
FINDINGS: Cardiovascular: Satisfactory opacification the bilateral pulmonary
arteries to the lobar level. No evidence of pulmonary embolism.

Although not tailored for evaluation of the thoracic aorta, there is
no evidence thoracic aortic aneurysm or dissection.

The heart is normal in size.  No pericardial effusion.

Mediastinum/Nodes: No suspicious mediastinal lymphadenopathy.

Visualized thyroid is unremarkable.

Lungs/Pleura: Lungs are clear.

No suspicious pulmonary nodules.

No focal consolidation.

No pleural effusion or pneumothorax.

Upper Abdomen: Visualized upper abdomen is grossly unremarkable.

Musculoskeletal: Degenerative changes of the visualized
thoracolumbar spine.

Review of the MIP images confirms the above findings.
IMPRESSION: No evidence of pulmonary embolism.

Negative CT chest.
